# Patient Record
Sex: Female | Born: 1980 | Race: Black or African American | Hispanic: No | Marital: Single | Smoking: Never smoker
Health system: Southern US, Community
[De-identification: ages and names within clinical notes are randomized; demographics above are authoritative.]

---

## 2015-06-14 ENCOUNTER — Emergency Department (INDEPENDENT_AMBULATORY_CARE_PROVIDER_SITE_OTHER): Payer: Managed Care, Other (non HMO)

## 2015-06-14 ENCOUNTER — Emergency Department (INDEPENDENT_AMBULATORY_CARE_PROVIDER_SITE_OTHER)
Admission: EM | Admit: 2015-06-14 | Discharge: 2015-06-14 | Disposition: A | Discharge status: Home / Self Care | Payer: Managed Care, Other (non HMO) | Source: Home / Self Care | Attending: Family Medicine | Admitting: Family Medicine

## 2015-06-14 ENCOUNTER — Encounter: Payer: Self-pay | Admitting: *Deleted

## 2015-06-14 DIAGNOSIS — R0789 Other chest pain: Secondary | ICD-10-CM | POA: Diagnosis not present

## 2015-06-14 DIAGNOSIS — R0781 Pleurodynia: Secondary | ICD-10-CM

## 2015-06-14 LAB — POCT URINALYSIS DIPSTICK
BILIRUBIN UA: NEGATIVE
Glucose, UA: NEGATIVE
Ketones, UA: NEGATIVE
Leukocytes, UA: NEGATIVE
NITRITE UA: NEGATIVE
PH UA: 6 (ref 5–8)
Protein, UA: NEGATIVE
RBC UA: NEGATIVE
Spec Grav, UA: 1.015 (ref 1.005–1.03)
Urobilinogen, UA: 0.2 (ref 0–1)

## 2015-06-14 MED ORDER — MELOXICAM 15 MG PO TABS: 15.0000 mg | ORAL_TABLET | Freq: Every day | ORAL | Status: DC

## 2015-06-14 NOTE — ED Notes (Signed)
Pt c/o RT side low back pain x 12N  Today. Denies injury, fever or dysuria.

## 2015-06-14 NOTE — ED Provider Notes (Signed)
CSN: [id number]     Arrival date & time 06/14/15  1752 History   First MD Initiated Contact with Patient 06/14/15 1900     Chief Complaint  Patient presents with  . Back Pain      HPI Comments: While walking down stairs today, patient suddenly experienced right lower back pain/pressure.  The pain improved later, but is still present.  She recalls no injury.  No fever/chills or urinary symptoms.  No history of kidney stones.  No cough or recent URI. Patient is a 35 y.o. female presenting with back pain. The history is provided by the patient.  Back Pain Pain location: right posterior chest. Quality:  Aching Radiates to:  Does not radiate Pain severity:  Mild Pain is:  Same all the time Onset quality:  Sudden Duration:  4 hours Timing:  Constant Progression:  Improving Chronicity:  New Context: not lifting heavy objects and not recent injury   Relieved by:  None tried Worsened by:  Movement Ineffective treatments:  None tried Associated symptoms: no abdominal pain, no abdominal swelling, no bladder incontinence, no bowel incontinence, no chest pain, no dysuria, no fever, no leg pain, no paresthesias, no pelvic pain, no perianal numbness and no tingling     History reviewed. No pertinent past medical history. Past Surgical History  Procedure Laterality Date  . Cesarean section      x 3    History reviewed. No pertinent family history. Social History  Substance Use Topics  . Smoking status: Never Smoker   . Smokeless tobacco: None  . Alcohol Use: Yes   OB History    No data available     Review of Systems  Constitutional: Negative for fever.  Cardiovascular: Negative for chest pain.  Gastrointestinal: Negative for abdominal pain and bowel incontinence.  Genitourinary: Negative for bladder incontinence, dysuria and pelvic pain.  Musculoskeletal: Positive for back pain.  Neurological: Negative for tingling and paresthesias.  All other systems reviewed and are  negative.   Allergies  Review of patient's allergies indicates no known allergies.  Home Medications   Prior to Admission medications   Medication Sig Start Date End Date Taking? Authorizing Provider  meloxicam (MOBIC) 15 MG tablet Take 1 tablet (15 mg total) by mouth daily. Take with food each morning 06/14/15   Lattie Haw, MD   Meds Ordered and Administered this Visit  Medications - No data to display  BP 133/88 mmHg  Pulse 84  Temp(Src) 98.8 F (37.1 C) (Oral)  Resp 16  Ht 5' (1.524 m)  Wt 180 lb (81.647 kg)  BMI 35.15 kg/m2  SpO2 100%  LMP 05/26/2015 No data found.   Physical Exam  Constitutional: She is oriented to person, place, and time. She appears well-developed and well-nourished. No distress.  HENT:  Head: Normocephalic.  Mouth/Throat: Oropharynx is clear and moist.  Eyes: Conjunctivae are normal. Pupils are equal, round, and reactive to light.  Neck: Neck supple.  Cardiovascular: Normal heart sounds.   Pulmonary/Chest: Breath sounds normal.  Abdominal: There is no tenderness.  Musculoskeletal: She exhibits no edema.       Thoracic back: She exhibits tenderness, bony tenderness and pain. She exhibits normal range of motion, no swelling, no edema, no deformity and no laceration.       Back:  Right posterior/inferior ribs have tenderness to palpation as noted on diagram.    Lymphadenopathy:    She has no cervical adenopathy.  Neurological: She is alert and oriented to person, place,  and time.  Skin: Skin is warm and dry. No rash noted.  Nursing note and vitals reviewed.   ED Course  Procedures none    Labs Reviewed  POCT URINALYSIS DIPSTICK:  negative    Imaging Review Dg Ribs Unilateral W/chest Right  06/14/2015  CLINICAL DATA:  RIGHT posterior rib pain.  Sudden onset. EXAM: RIGHT RIBS AND CHEST - 3+ VIEW COMPARISON:  None. FINDINGS: Normal mediastinum and cardiac silhouette. Normal pulmonary vasculature. No evidence of effusion, infiltrate, or  pneumothorax. No acute bony abnormality. Dedicated views of the RIGHT ribs demonstrate no fracture. IMPRESSION: No rib fracture.  No acute cardiopulmonary findings. Electronically Signed   By: Genevive BiStewart  Edmunds M.D.   On: 06/14/2015 19:41      MDM   1. Posterior chest pain ?etiology   Begin Mobic 15mg  daily. Apply ice pack for 20 to 30 minutes, 3 to 4 times daily  Continue until pain decreases.     Followup with Family Doctor if not improved in one week.     Lattie HawStephen A Kallee Nam, MD 06/17/15 1051

## 2015-06-14 NOTE — Discharge Instructions (Signed)
Apply ice pack for 20 to 30 minutes, 3 to 4 times daily  Continue until pain decreases.       Chest Wall Pain Chest wall pain is pain in or around the bones and muscles of your chest. Sometimes, an injury causes this pain. Sometimes, the cause may not be known. This pain may take several weeks or longer to get better. HOME CARE INSTRUCTIONS  Pay attention to any changes in your symptoms. Take these actions to help with your pain:   Rest as told by your health care provider.   Avoid activities that cause pain. These include any activities that use your chest muscles or your abdominal and side muscles to lift heavy items.   If directed, apply ice to the painful area:  Put ice in a plastic bag.  Place a towel between your skin and the bag.  Leave the ice on for 20 minutes, 2-3 times per day.  Take over-the-counter and prescription medicines only as told by your health care provider.  Do not use tobacco products, including cigarettes, chewing tobacco, and e-cigarettes. If you need help quitting, ask your health care provider.  Keep all follow-up visits as told by your health care provider. This is important. SEEK MEDICAL CARE IF:  You have a fever.  Your chest pain becomes worse.  You have new symptoms. SEEK IMMEDIATE MEDICAL CARE IF:  You have nausea or vomiting.  You feel sweaty or light-headed.  You have a cough with phlegm (sputum) or you cough up blood.  You develop shortness of breath.   This information is not intended to replace advice given to you by your health care provider. Make sure you discuss any questions you have with your health care provider.   Document Released: 12/25/2004 Document Revised: 09/15/2014 Document Reviewed: 03/22/2014 Elsevier Interactive Patient Education [employer]2016 [employer]

## 2015-06-20 ENCOUNTER — Emergency Department
Admission: EM | Admit: 2015-06-20 | Discharge: 2015-06-20 | Disposition: A | Discharge status: Home / Self Care | Payer: Managed Care, Other (non HMO) | Source: Home / Self Care | Attending: Family Medicine | Admitting: Family Medicine

## 2015-06-20 ENCOUNTER — Encounter: Payer: Self-pay | Admitting: *Deleted

## 2015-06-20 DIAGNOSIS — S01339A Puncture wound without foreign body of unspecified ear, initial encounter: Principal | ICD-10-CM

## 2015-06-20 DIAGNOSIS — T798XXA Other early complications of trauma, initial encounter: Secondary | ICD-10-CM | POA: Diagnosis not present

## 2015-06-20 DIAGNOSIS — L089 Local infection of the skin and subcutaneous tissue, unspecified: Secondary | ICD-10-CM

## 2015-06-20 MED ORDER — MUPIROCIN 2 % EX OINT: TOPICAL_OINTMENT | CUTANEOUS | Status: AC

## 2015-06-20 NOTE — Discharge Instructions (Signed)
Be sure to keep wound clean with gentle washing with warm soap and water. You may use a baby washcloth, which is typically softer than most wash clothes.  You may also use Dove soap or a baby shampoo, which are gentle on skin.  You may moisten the scab in the shower before applying ointment. You should be able to gently move your piercing back in forth to help get the antibiotic into the wound and helps clean out bacteria.     Wound Infection A wound infection happens when a type of germ (bacteria) grows in a wound. Caring for the infection can help the wound heal. Wound infections need treatment. HOME CARE   Only take medicine as told by your doctor.  Take your antibiotic medicine as told. Finish it even if you start to feel better.  Clean the wound with mild soap and water as told. Rinse the soap off. Pat the area dry with a clean towel. Do not rub the wound.  Change any bandages (dressings) as told by your doctor.  Put cream and a bandage on the wound as told by your doctor.  If the bandage sticks, wet it with soapy water to remove the bandage.  Change the bandage if it gets wet, dirty, or starts to smell.  Take showers. Do not take baths, swim, or do anything that puts your wound under water.  Avoid exercise that makes you sweat.  If your wound itches, use a medicine that helps stop itching. Do not pick or scratch at the wound.  Keep all doctor visits as told. GET HELP RIGHT AWAY IF:   You have more puffiness (swelling), pain, or redness around the wound.  You have more yellowish-white fluid (pus) coming from the wound.  You have a bad smell coming from the wound.  Your wound breaks open more.  You have a fever. MAKE SURE YOU:   Understand these instructions.  Will watch your condition.  Will get help right away if you are not doing well or get worse.   This information is not intended to replace advice given to you by your health care provider. Make sure you  discuss any questions you have with your health care provider.   Document Released: 10/04/2007 Document Revised: 03/19/2011 Document Reviewed: 06/14/2014 Elsevier Interactive Patient Education [employer]

## 2015-06-20 NOTE — ED Notes (Signed)
Pt c/o LT ear pain and swelling x 2 wks. She reports getting a new ear piercing 01/2015'.

## 2015-06-20 NOTE — ED Provider Notes (Signed)
CSN: [id number]     Arrival date & time 06/20/15  1748 History   First MD Initiated Contact with Patient 06/20/15 1754     Chief Complaint  Patient presents with  . Otalgia   (Consider location/radiation/quality/duration/timing/severity/associated sxs/prior Treatment) HPI Jaime King is a 35 y.o. female presenting to UC with c/o Left upper external ear pain around a piercing she got in January 2017.  She reports it got infected a few months ago after a reaction to a hair product she used but was able to treat it with saline and over the counter neosporin.  Over the last 2 weeks she noticed worsening redness, swelling, pain, and crusting around the piercing. Pain is worse with even gentle touch.  Denies fever, n/v/d. She notes she is going out of town tomorrow and wanted to be evaluated before it got worse.    History reviewed. No pertinent past medical history. Past Surgical History  Procedure Laterality Date  . Cesarean section      x 3    History reviewed. No pertinent family history. Social History  Substance Use Topics  . Smoking status: Never Smoker   . Smokeless tobacco: None  . Alcohol Use: Yes   OB History    No data available     Review of Systems  Constitutional: Negative for fever and chills.  HENT: Positive for ear pain (Left external ear ).   Skin: Positive for color change and wound. Negative for rash.    Allergies  Review of patient's allergies indicates no known allergies.  Home Medications   Prior to Admission medications   Medication Sig Start Date End Date Taking? Authorizing Provider  mupirocin ointment (BACTROBAN) 2 % Apply to Left ear 3 times daily for 5 days 06/20/15   Junius FinnerErin O'Malley, PA-C   Meds Ordered and Administered this Visit  Medications - No data to display  BP 125/82 mmHg  Pulse 89  Temp(Src) 97.6 F (36.4 C) (Oral)  Resp 16  Ht 5' (1.524 m)  Wt 180 lb (81.647 kg)  BMI 35.15 kg/m2  SpO2 96%  LMP 05/26/2015 No data  found.   Physical Exam  Constitutional: She is oriented to person, place, and time. She appears well-developed and well-nourished.  HENT:  Head: Normocephalic and atraumatic.  Ears:  Left external ear, superior helix- piercing in place, small amount of crusting discharge and mild erythema. Tender to light touch. Minimal edema.   Eyes: EOM are normal.  Neck: Normal range of motion.  Cardiovascular: Normal rate.   Pulmonary/Chest: Effort normal.  Musculoskeletal: Normal range of motion.  Neurological: She is alert and oriented to person, place, and time.  Skin: Skin is warm and dry. There is erythema.  Psychiatric: She has a normal mood and affect. Her behavior is normal.  Nursing note and vitals reviewed.   ED Course  Procedures (including critical care time)  Labs Review Labs Reviewed - No data to display  Imaging Review No results found.    MDM   1. Pierced ear infection, initial encounter    Exam c/w skin infection secondary to ear piercing.    Rx: Mupirocin Discussed proper cleaning with piercing in place. Encouraged f/u in 4-5 days if not improving, sooner if worsening. Patient verbalized understanding and agreement with treatment plan.     Junius Finnerrin O'Malley, PA-C 06/20/15 1919

## 2017-10-10 IMAGING — DX DG RIBS W/ CHEST 3+V*R*
3 series · 3 of 3 positions shown · non-contrast
Comparison: None.

CLINICAL DATA: RIGHT posterior rib pain.  Sudden onset.

EXAM:
RIGHT RIBS AND CHEST - 3+ VIEW

[chest pa]
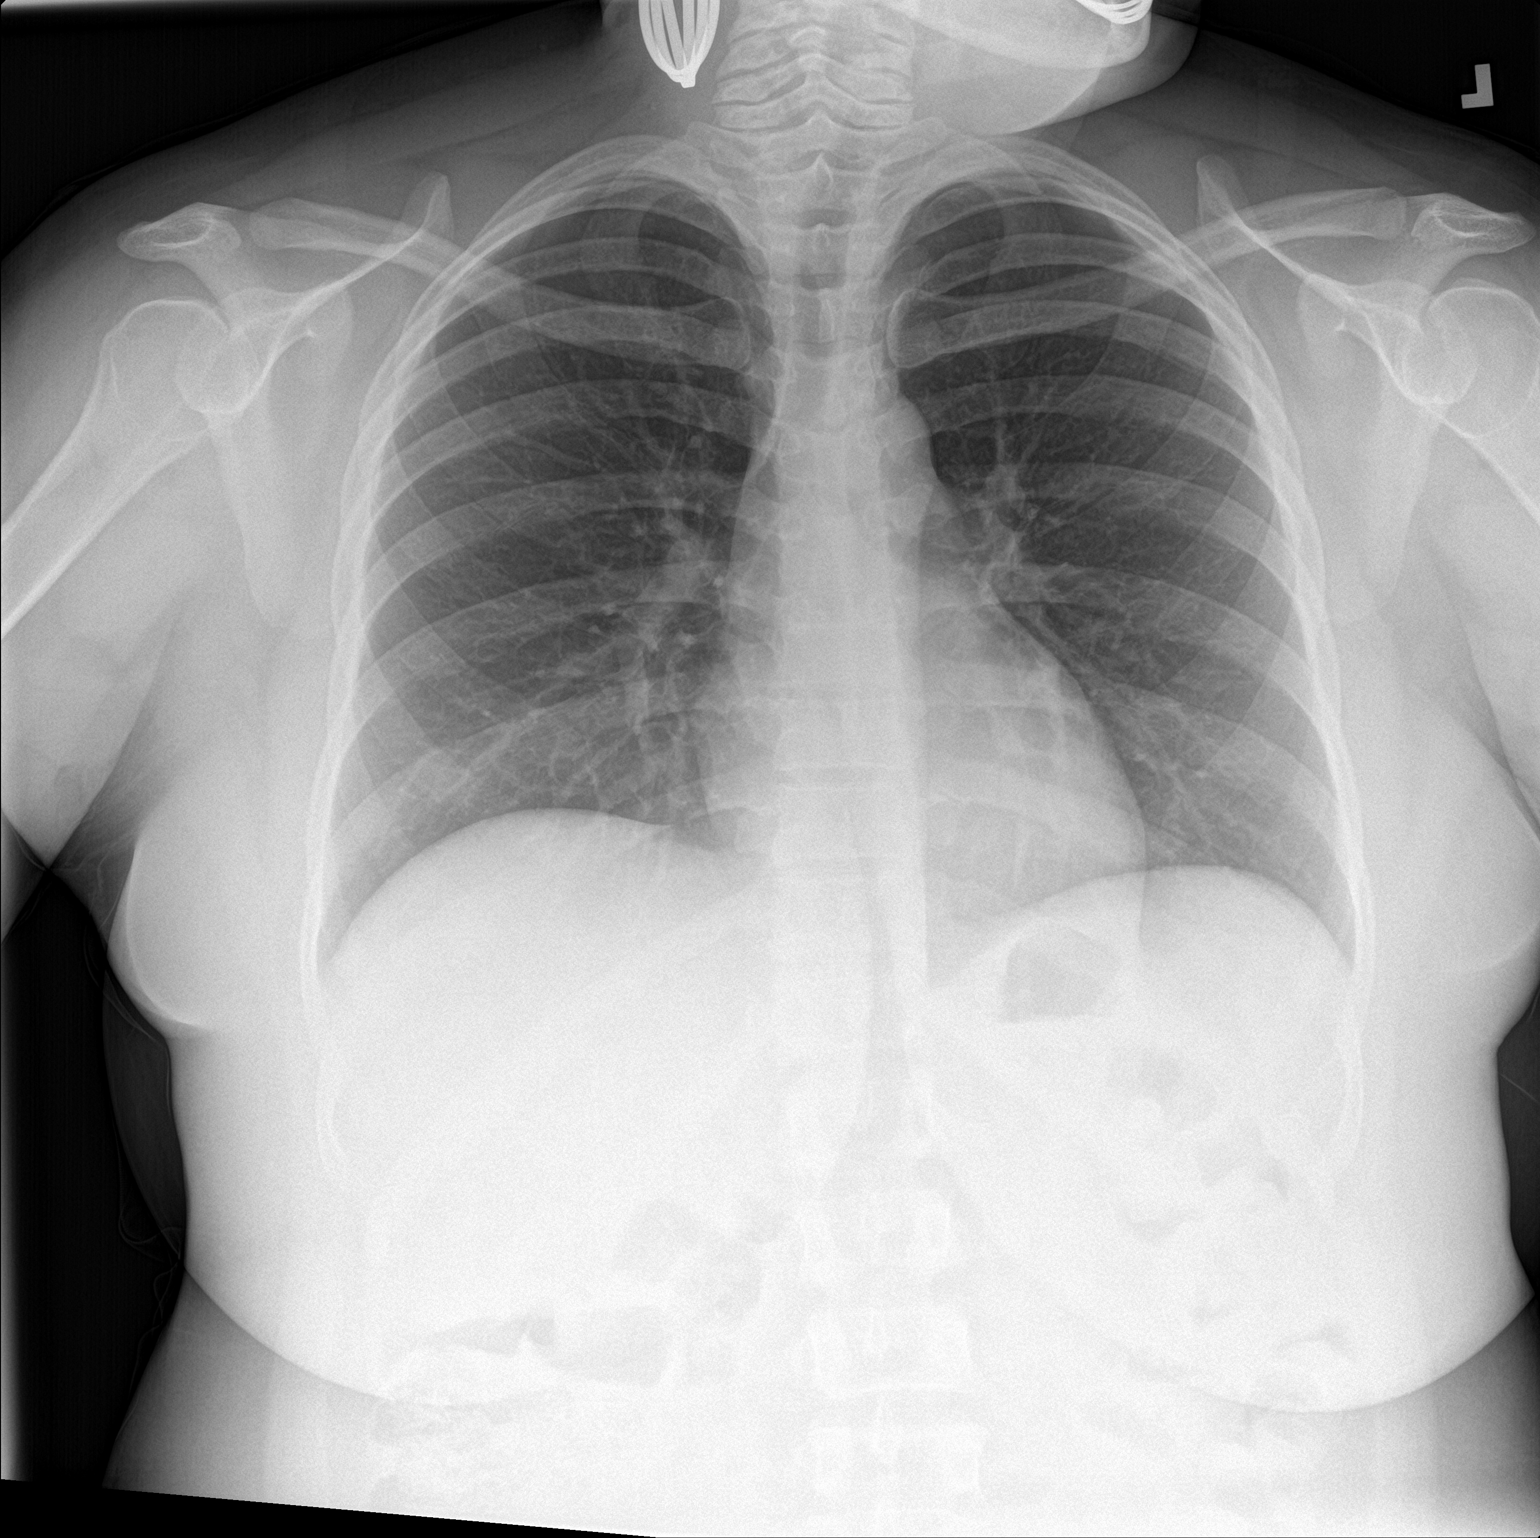

[rib ap]
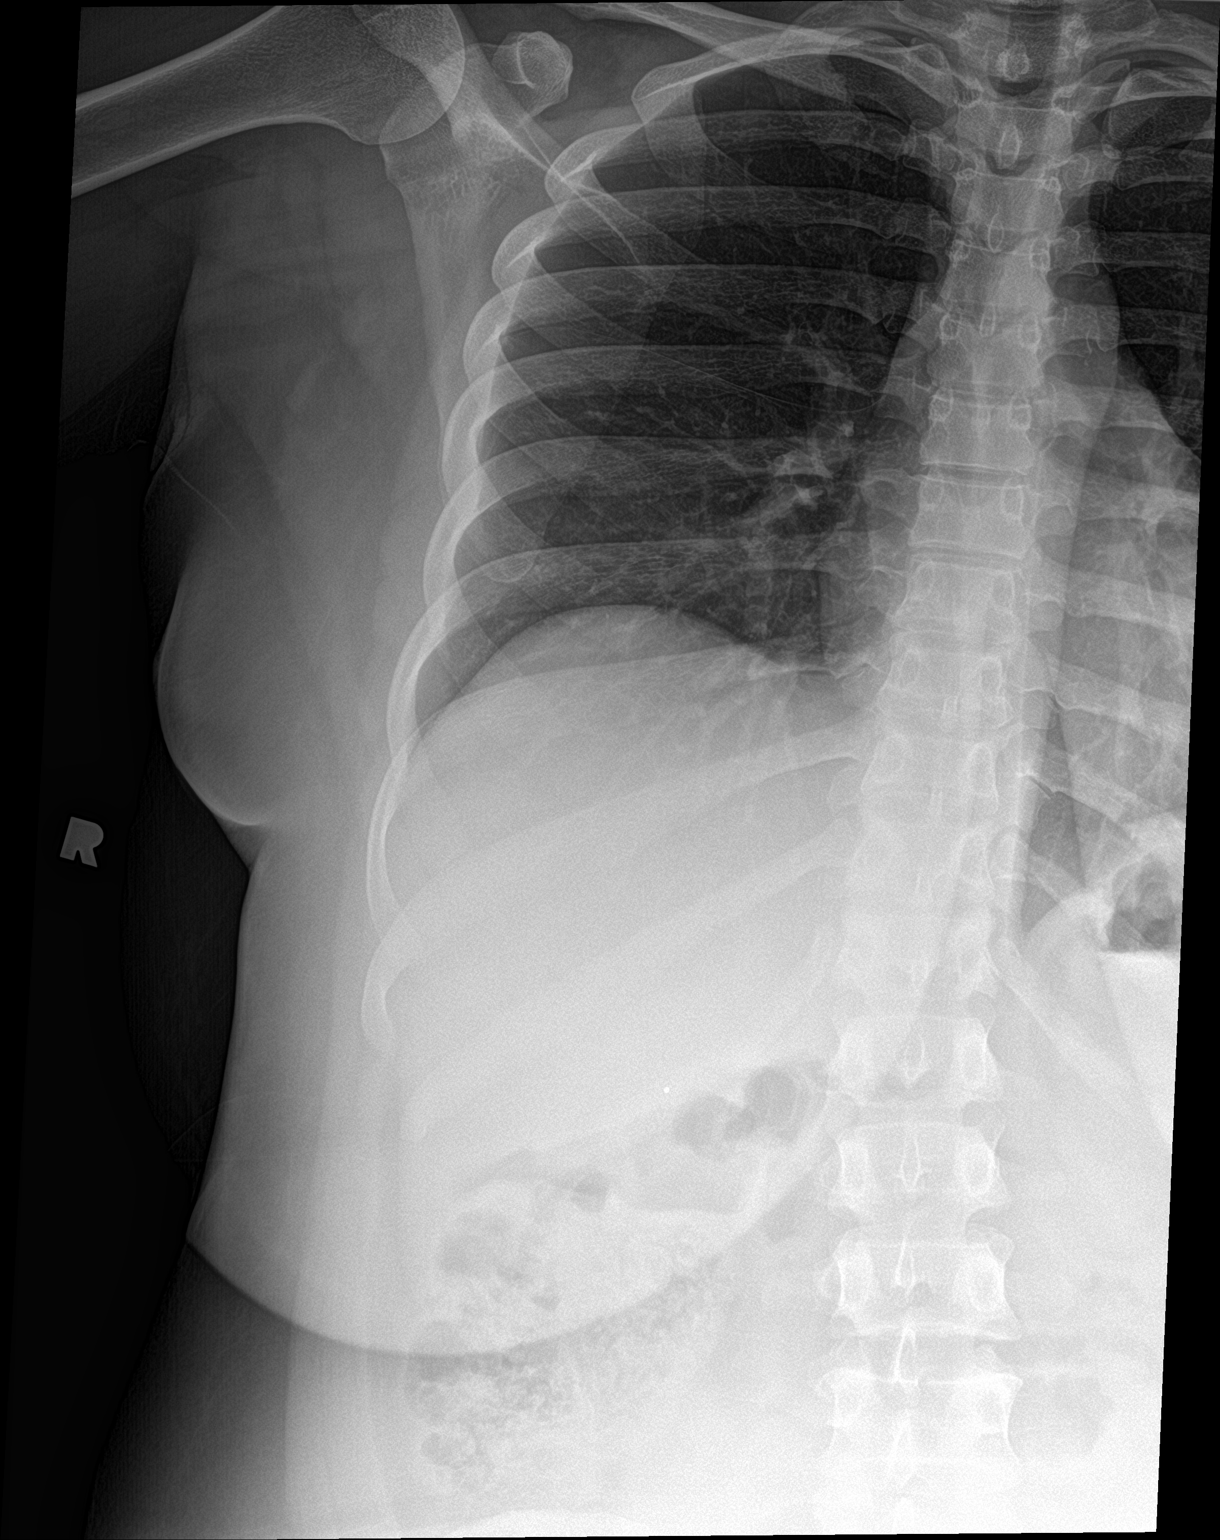

[rib ap obl]
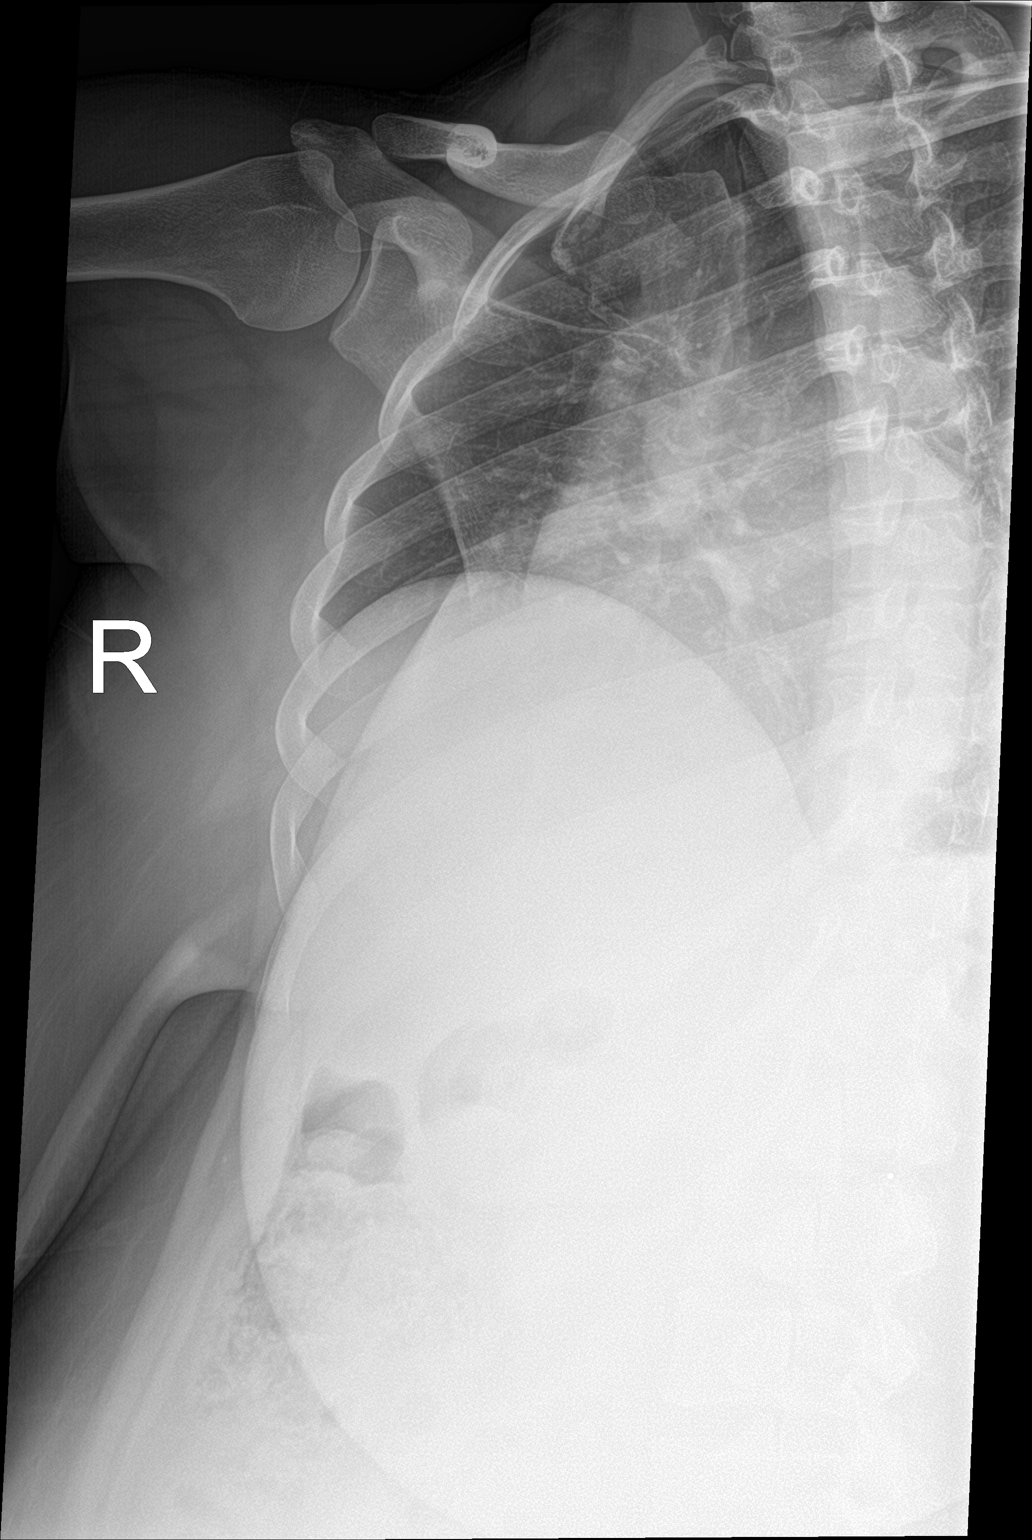

[3 of 3 positions shown; findings below may reference images not displayed]

FINDINGS: Normal mediastinum and cardiac silhouette. Normal pulmonary
vasculature. No evidence of effusion, infiltrate, or pneumothorax.
No acute bony abnormality.

Dedicated views of the RIGHT ribs demonstrate no fracture.
IMPRESSION: No rib fracture.  No acute cardiopulmonary findings.
